# Patient Record
Sex: Male | Born: 1975 | Race: White | Hispanic: No | Marital: Married
Health system: Midwestern US, Community
[De-identification: ages and names within clinical notes are randomized; demographics above are authoritative.]

---

## 2013-01-21 LAB — POC HEMATOCRIT
HCT: 43 % (ref 40–54)
HGB: 14.8 gm/dl (ref 13.0–17.2)

## 2013-01-21 NOTE — Procedures (Signed)
[facility]  PULMONARY REPORT  NAME:  Maxwell Castro  SEX:   M  DATE: 01/21/2013  SS: [id number]  DOB: 1975/02/22  MR#    [id number]  ROOM: RT  BILLING: [id number]  REFERRING PHYSICIAN:        cc: Wallis Bamberg NP    REFERRING PROVIDER:  Wallis Bamberg, NP.     DIAGNOSIS:  Dyspnea on exertion.    COMMENTS:  Spirometry data is repeatable.  The patient followed instructions well and   appeared to give a maximal effort.  DLCO was corrected for a hemoglobin of   14.8.  No complaints of shortness of breath or cough.    SPIROMETRY:  Volume time curve analysis revealed forced expiratory time greater than 6   seconds with achievement of terminal plateau.  Flow volume curve analysis   revealed no significant flattening in either inspiratory or expiratory limb of   the flow volume curve.  FVC is 6.25 liters, 163% of predicted.  FEV-1 is 4.88   liters, 154% of predicted.  Ratio is 78.  Following bronchodilator therapy,   there is no significant improvement in either FEV1 or FVC values.  Based on   post-bronchodilator therapy testing, as per GOLD criteria, the patient has no   obstructive ventilatory impairment.    LUNG VOLUMES:  Lung volumes were measured by plethysmography.  Total lung capacity 7.09   liters, 118% of predicted.  RV is 0.84 liters, 48% of predicted.  Ratio is 12.    Mean airway resistance is within normal limits.    DIFFUSION CAPACITY:  Absolute DLCO is 28.7 mL/mmHg per minute which is 105% of predicted.    IMPRESSION:  1.  No obstructive ventilatory impairment.  2.  No significant response following bronchodilator therapy.  3.  No restrictive ventilatory impairment.  4.  Mild hyperinflation.  5.  Normal mean airway resistance.  6.  Normal diffusion capacity for carbon monoxide suggestive of preserved or   intact alveolar capillary basement membrane.  Please correlate clinically.      ___________________  Claudette Laws MD  Dictated By: .   [facility]  D:01/28/2013 07:11:49  T: 01/28/2013  08:25:48  [id number]  Authenticated by Claudette Laws, M.D. On 01/28/2013 09:09:04 PM

## 2013-01-28 NOTE — Procedures (Signed)
[facility]  PULMONARY REPORT  NAME:  Maxwell Castro  SEX:   M  DATE: 01/21/2013  SS: [id number]  DOB: Sep 10, 1975  MR#    [id number]  ROOM: RT  BILLING: [id number]  REFERRING PHYSICIAN:        cc: Wallis Bamberg NP    REFERRING PROVIDER:  Wallis Bamberg, NP.     DIAGNOSIS:  Dyspnea on exertion.    COMMENTS:  Spirometry data is repeatable.  The patient followed instructions well and   appeared to give a maximal effort.  DLCO was corrected for a hemoglobin of   14.8.  No complaints of shortness of breath or cough.    SPIROMETRY:  Volume time curve analysis revealed forced expiratory time greater than 6   seconds with achievement of terminal plateau.  Flow volume curve analysis   revealed no significant flattening in either inspiratory or expiratory limb of   the flow volume curve.  FVC is 6.25 liters, 163% of predicted.  FEV-1 is 4.88   liters, 154% of predicted.  Ratio is 78.  Following bronchodilator therapy,   there is no significant improvement in either FEV1 or FVC values.  Based on   post-bronchodilator therapy testing, as per GOLD criteria, the patient has no   obstructive ventilatory impairment.    LUNG VOLUMES:  Lung volumes were measured by plethysmography.  Total lung capacity 7.09   liters, 118% of predicted.  RV is 0.84 liters, 48% of predicted.  Ratio is 12.    Mean airway resistance is within normal limits.    DIFFUSION CAPACITY:  Absolute DLCO is 28.7 mL/mmHg per minute which is 105% of predicted.    IMPRESSION:  1.  No obstructive ventilatory impairment.  2.  No significant response following bronchodilator therapy.  3.  No restrictive ventilatory impairment.  4.  Mild hyperinflation.  5.  Normal mean airway resistance.  6.  Normal diffusion capacity for carbon monoxide suggestive of preserved or   intact alveolar capillary basement membrane.  Please correlate clinically.      ___________________  Claudette Laws MD  Dictated By: .   [facility]  D:01/28/2013 07:11:49   T: 01/28/2013 08:25:48  [id number]  Authenticated by Claudette Laws, M.D. On 01/28/2013 09:09:04 PM

## 2013-10-03 NOTE — Procedures (Unsigned)
Patient Name: Maxwell PatriciaJON  Castro  Account Number: [id number]  Date of Birth: 04/08/1975  Record Number: [id number]  Date of Procedure: 10/03/2013  Referring Physician(s):    Endoscopist: Samuel JesterBeth Jaklic    PROCEDURE PERFORMED:  Flexible Sigmoidoscopy  -  HET hemorrhoidal treatment  INDICATIONS FOR EXAMINATION:      Instruments:  [id number]  Medications: MAC Visualization: Good  Tolerance:  Good Complications:  None Extent of Exam: rectosigmoid  Limitations:     Specimens:  None Classes: ASA Class II  Estimated Blood Loss:    Procedure Technique: With the patient in left lateral position, a digital anal   exam was performed using xylocaine jelly and surgical lubricant for   lubrication.  The flexible sigmoidoscope was then introduced into the anal   canal under direct vision and guided into the rectum.  A retroflexed view of   the rectum was obtained to evaluate the degree of internal hemorrhoidal   involvement.  The scope was removed, and the HET Bipolar Forceps, a   tissue-ligating device, is introduced intra-anally.  The treatment window was   opened and the mucosa-submucosa layers, carrying the superior hemorrhoidal   blood supply to the hemorrhoid, as well as the apex of the internal hemorrhoid   proximal to the dentate line, were allowed to protrude into the lumen of the   device. The resulting tissue fold was clamped in the treatment window of the   HET Bipolar Forceps and ligated using bipolar RF energy. The treatment   continued until the tissue temperature reached 55C, which was monitored using   the HET Temperature Monitor. The treatment window of the HET Bipolar Forceps   then was opened and treatment area evaluated. The target tissue fold appeared   to be well-ligated and moderately ischemic without signs of necrosis. The   device was then repositioned and the procedure repeated circumferentially, for   a total of 8 treatments.  Upon completion of the hemorrhoid ligation procedure with the HET Bipolar    Forceps, the device was withdrawn from the patient.  The flexible scope was   then re-introduced into the anal canal and a retroflexed view of the rectum   was obtained. There was no bleeding, excessive tissue trauma, anal fissure or   any other complications observed, and the target areas appeared well treated.    The scope was removed. The patient tolerated procedure well and appeared   stable and comfortable at the end of the procedure.  FINDINGS:  Circumferential moderate sized internal hemorrhoids  ENDOSCOPIC DIAGNOSIS:  Internal hemorrhoids  RECOMMENDATIONS:  Continue high fiber diet and/or fiber supplements indefinitely  Follow up in my office in 4-6 weeks.  COMMENTS:        Signature:_________________________________ Samuel JesterBeth Jaklic , M.D.       This procedure was electronically signed off on(Endoscopy): 10/03/2013 9:45:12   AM by Samuel JesterBeth Jaklic,  M.D.

## 2018-10-22 IMAGING — MR MRI SHOULDER LT WO CONTRAST
5 series · 40 of 40 positions shown · non-contrast
Comparison: None available.

INDICATION: Injury of muscle and tendon of the rotator cuff of left shoulder, initial encounter.  Left shoulder pain with limited range of motion.
TECHNIQUE: Multiplanar, multisequence MR imaging of the left shoulder without contrast.

[Series 5: t2_axial_fs · axial · left · 3.0mm · 0.50mm/px · z∈[-26,+66]mm · 9 of 24 slices shown]
[im 1/24]
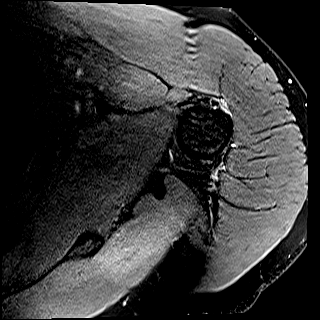
[im 3/24]
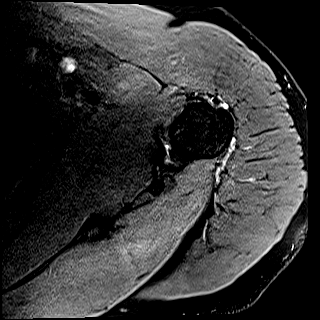
[im 6/24]
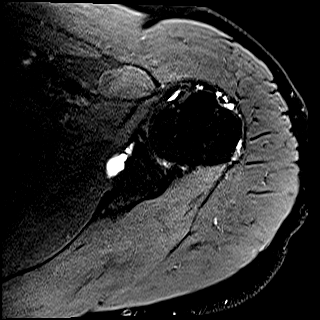
[im 9/24]
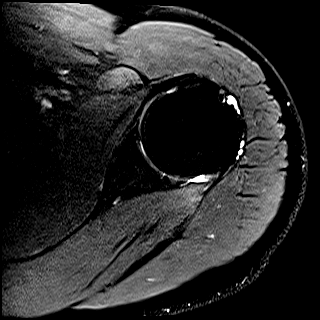
[im 12/24]
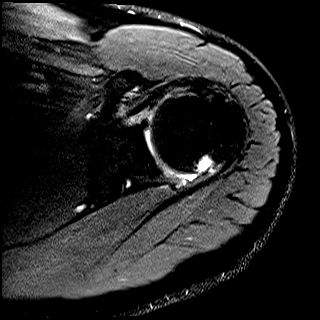
[im 15/24]
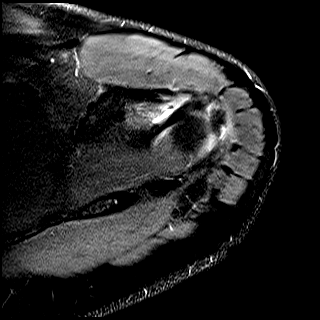
[im 18/24]
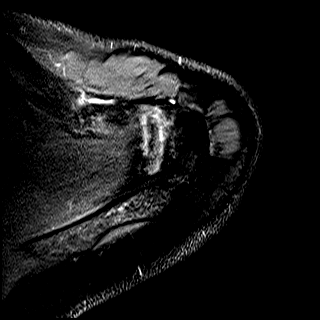
[im 21/24]
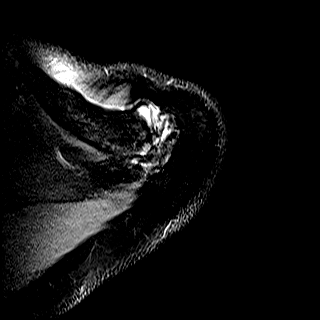
[im 24/24]
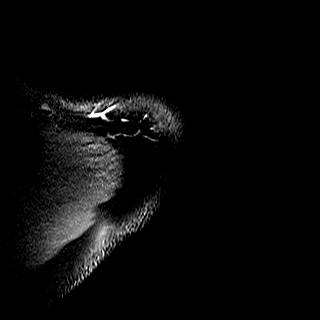

[Series 6: t2_cor_obl_fs · oblique · left · 3.0mm · 0.44mm/px · 7 of 19 slices shown (1 of 2)]
[im 1/19]
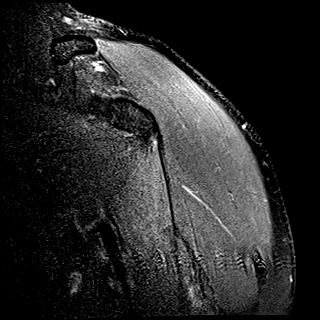
[im 4/19]
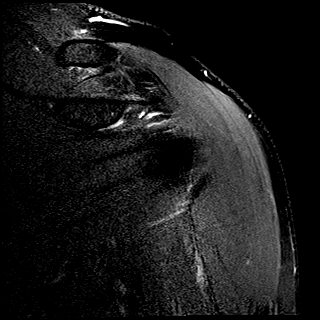
[im 7/19]
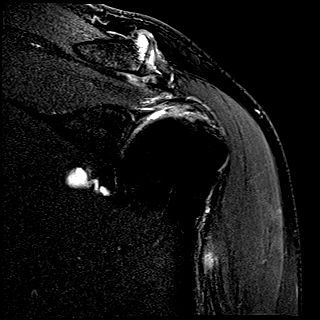
[im 10/19]
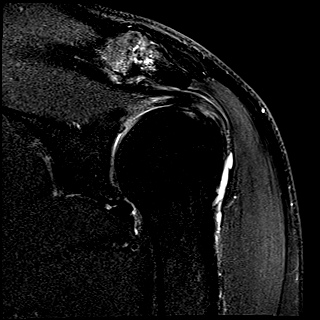
[im 13/19]
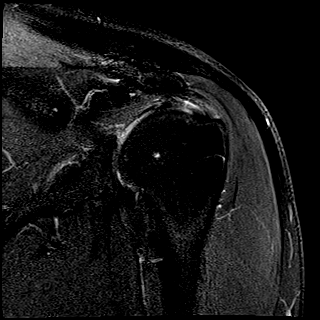
[im 16/19]
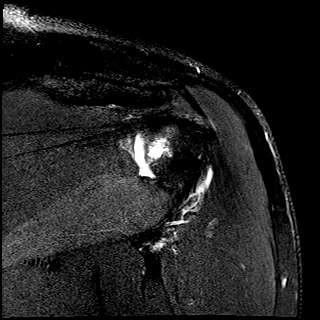
[im 19/19]
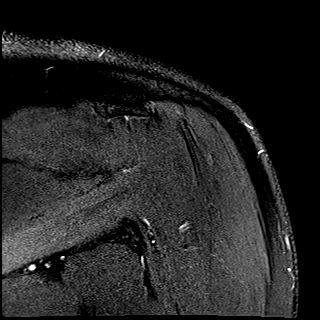

[Series 7: t2_sag_obl_fs · sagittal · left · 3.0mm · 0.44mm/px · 9 of 28 slices shown]
[im 1/28]
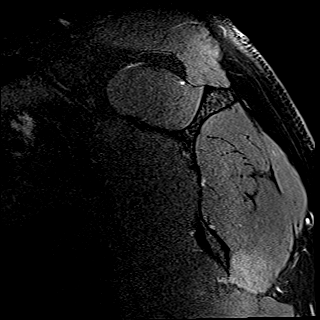
[im 4/28]
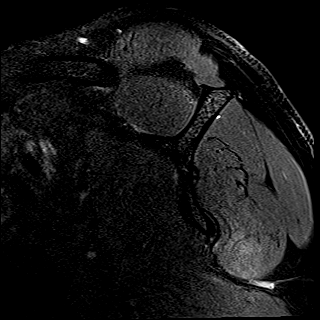
[im 7/28]
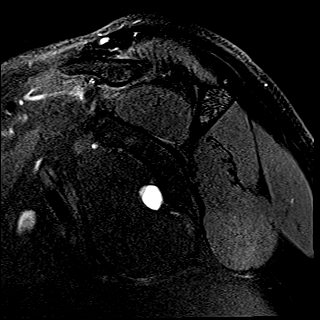
[im 11/28]
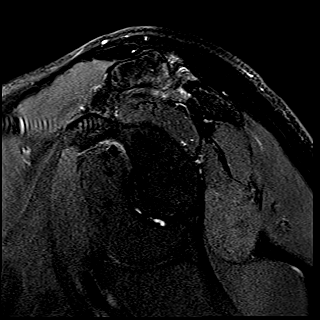
[im 14/28]
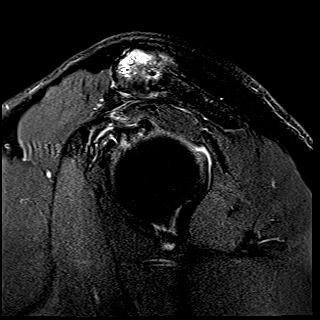
[im 17/28]
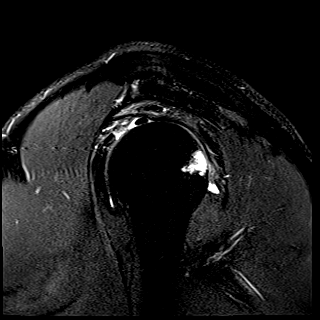
[im 21/28]
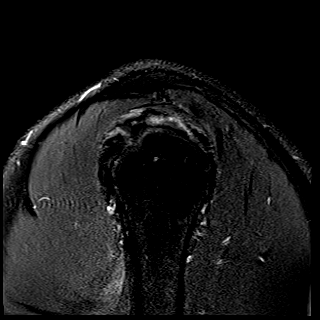
[im 24/28]
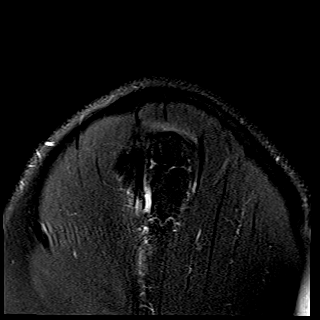
[im 28/28]
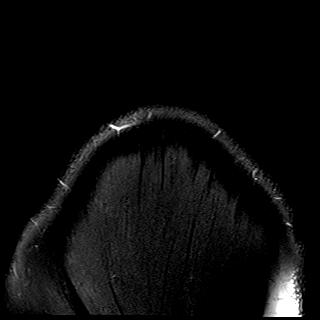

[Series 8: t1_sag_obl · sagittal · left · 3.0mm · 0.36mm/px · 9 of 28 slices shown]
[im 1/28]
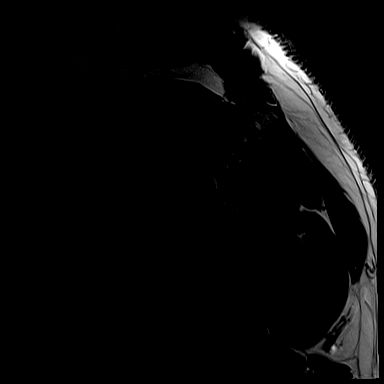
[im 4/28]
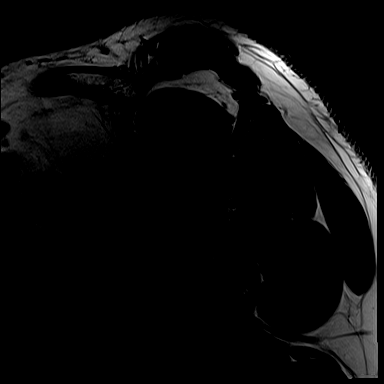
[im 7/28]
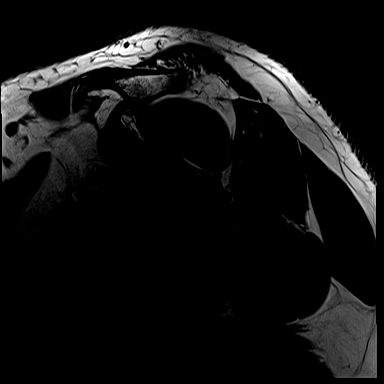
[im 11/28]
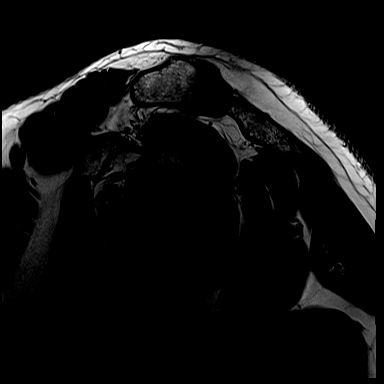
[im 14/28]
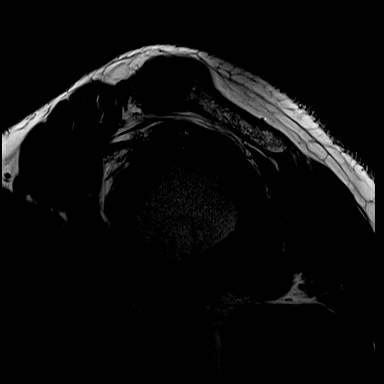
[im 17/28]
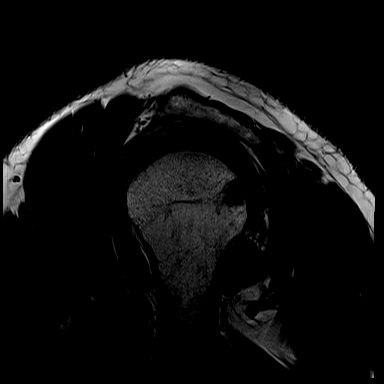
[im 21/28]
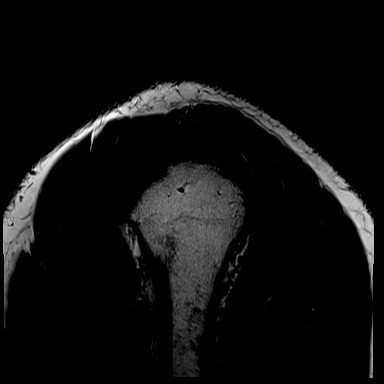
[im 24/28]
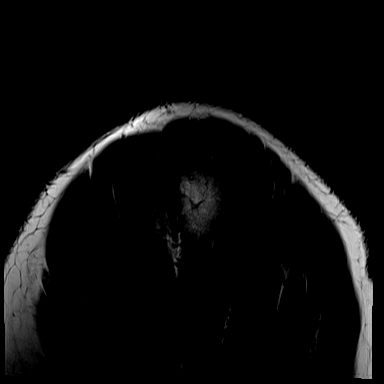
[im 28/28]
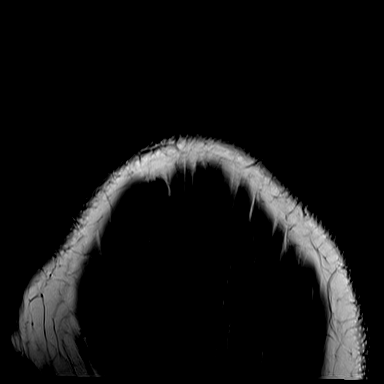

[Series 9: t2_cor_obl_fs · oblique · left · 3.0mm · 0.44mm/px · 6 of 19 slices shown (2 of 2)]
[im 1/19]
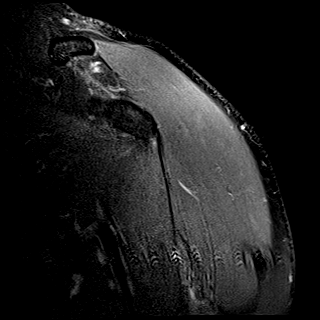
[im 4/19]
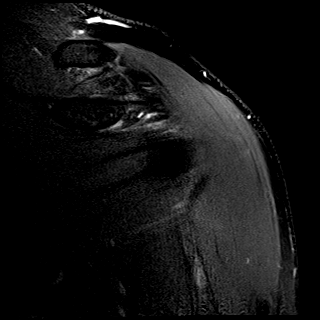
[im 8/19]
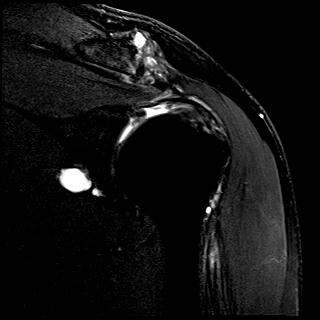
[im 11/19]
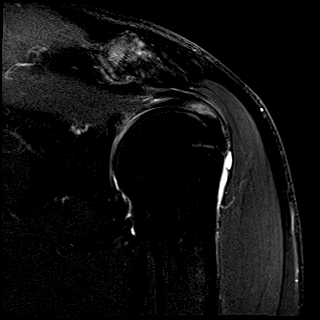
[im 15/19]
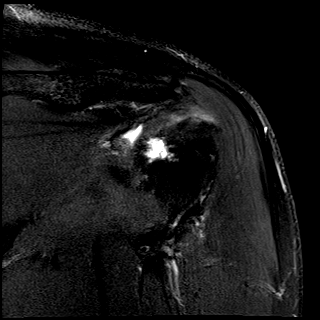
[im 19/19]
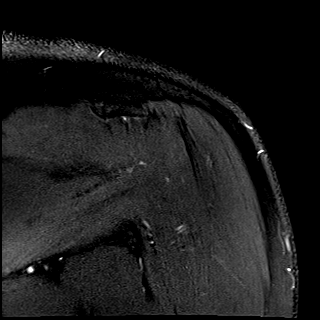

[40 of 40 positions shown; findings below may reference images not displayed]

FINDINGS: OSSEOUS ACROMIAL OUTLET: Moderate acromioclavicular joint osteoarthritis. Trace fluid is present within the subacromial/subdeltoid bursa. The acromion is nonhooked. The coracoclavicular and coracoacromial ligaments are intact.

ROTATOR CUFF: Mild supraspinatus and anterior infraspinatus tendinosis with mild articular surface fraying of the supraspinatus tendon, medial to the footprint. The teres minor and subscapularis tendons are intact. The rotator cuff musculature is maintained. Mild edema is present within the teres minor muscle, which can be seen in the setting of quadrilateral space syndrome. No mass is noted within the quadrilateral space.

LABRUM: Tearing of the anterior inferior and inferior labrum is present with a large paralabral cyst measuring 2.2 x 0.8 x 1.1 cm extending from the anterior inferior labrum, medially along the anterior glenoid. Fraying of the superior labrum.

BICEPS TENDON: The proximal intra-articular and extra-articular long head biceps tendon are intact.

OSSEOUS AND GLENOHUMERAL JOINT: No acute fracture, avascular necrosis or aggressive osseous lesion. The glenohumeral articular cartilage is maintained. No focal chondral defect is identified. Glenohumeral joint is normal in alignment.

OTHER: The inferior glenohumeral ligament is unremarkable. No glenohumeral joint effusion.
IMPRESSION: 1.
Mild supraspinatus and infraspinatus tendinosis without significant partial-thickness or full-thickness rotator cuff tear.

2.
Tearing of the anterior inferior and inferior labrum with large paralabral cyst.

3.
Mild edema within the teres minor muscle without associated fatty atrophy. These findings can be seen in the setting of quadrilateral space syndrome. No mass is noted within the quadrilateral space.

4.
Moderate acromioclavicular joint osteoarthritis.

## 2019-05-06 IMAGING — CR C-SPINE 2 or 3 views
1 series · 3 of 3 positions shown · non-contrast
Comparison: None

Cervical spine radiographs, 3 views
INDICATION: Neck pain

[Series 2: lat · 0.17mm/px · 3 of 3 slices shown]
[im 1/3]
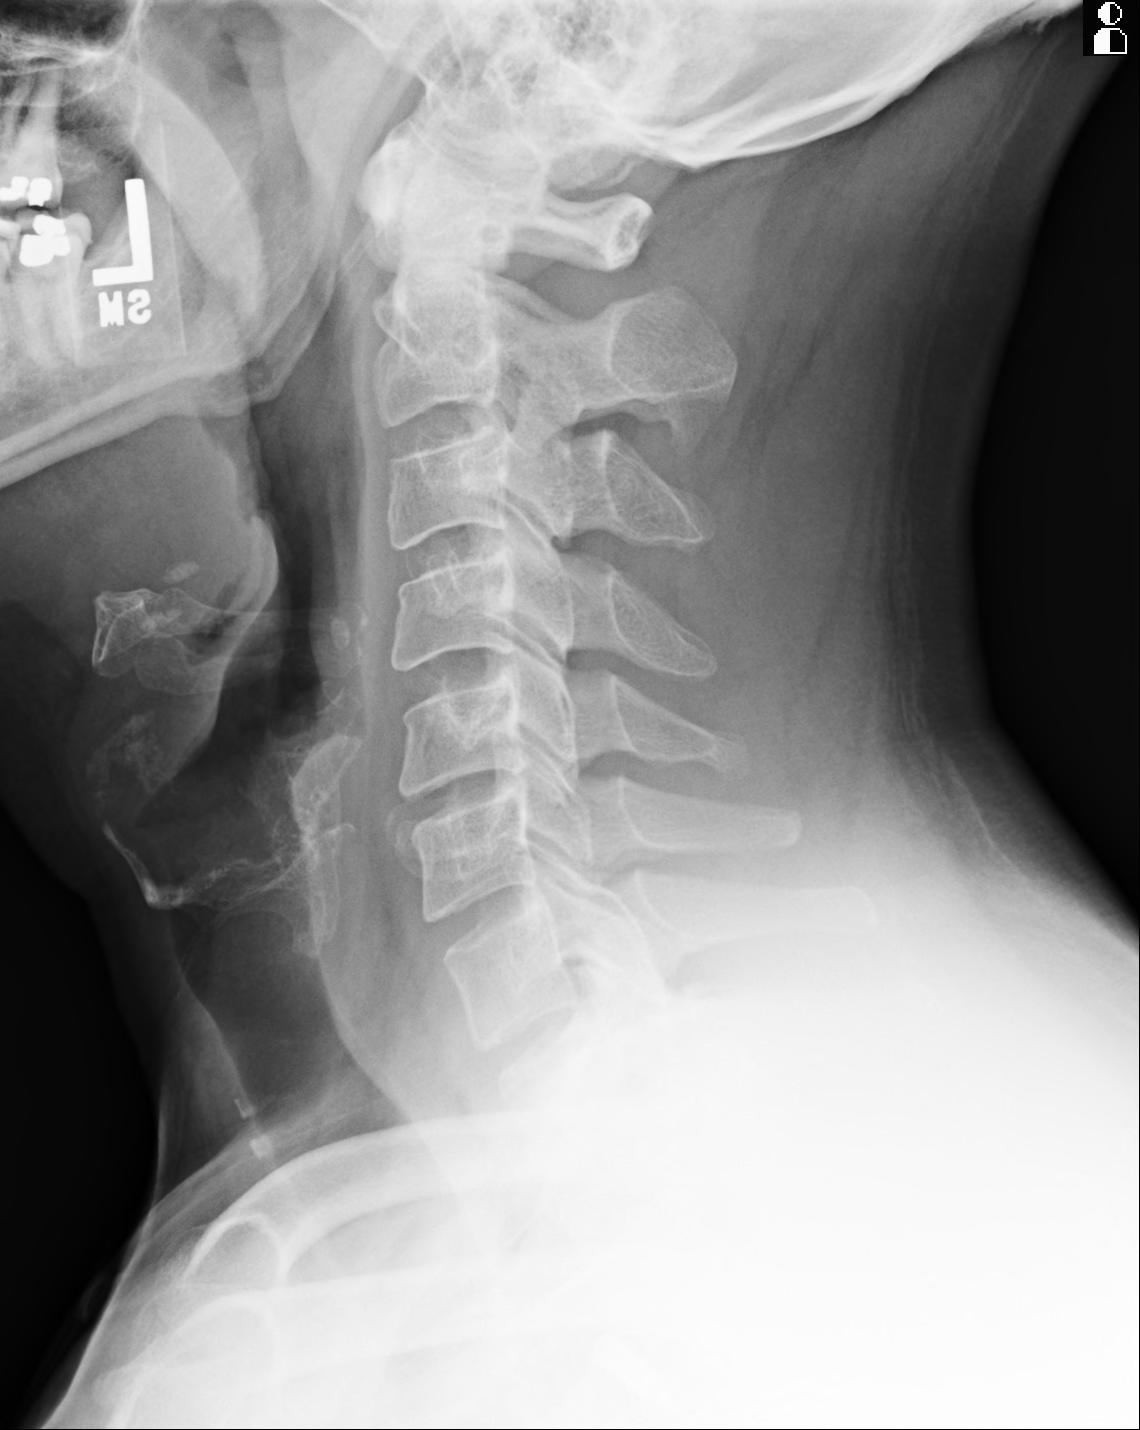
[im 2/3]
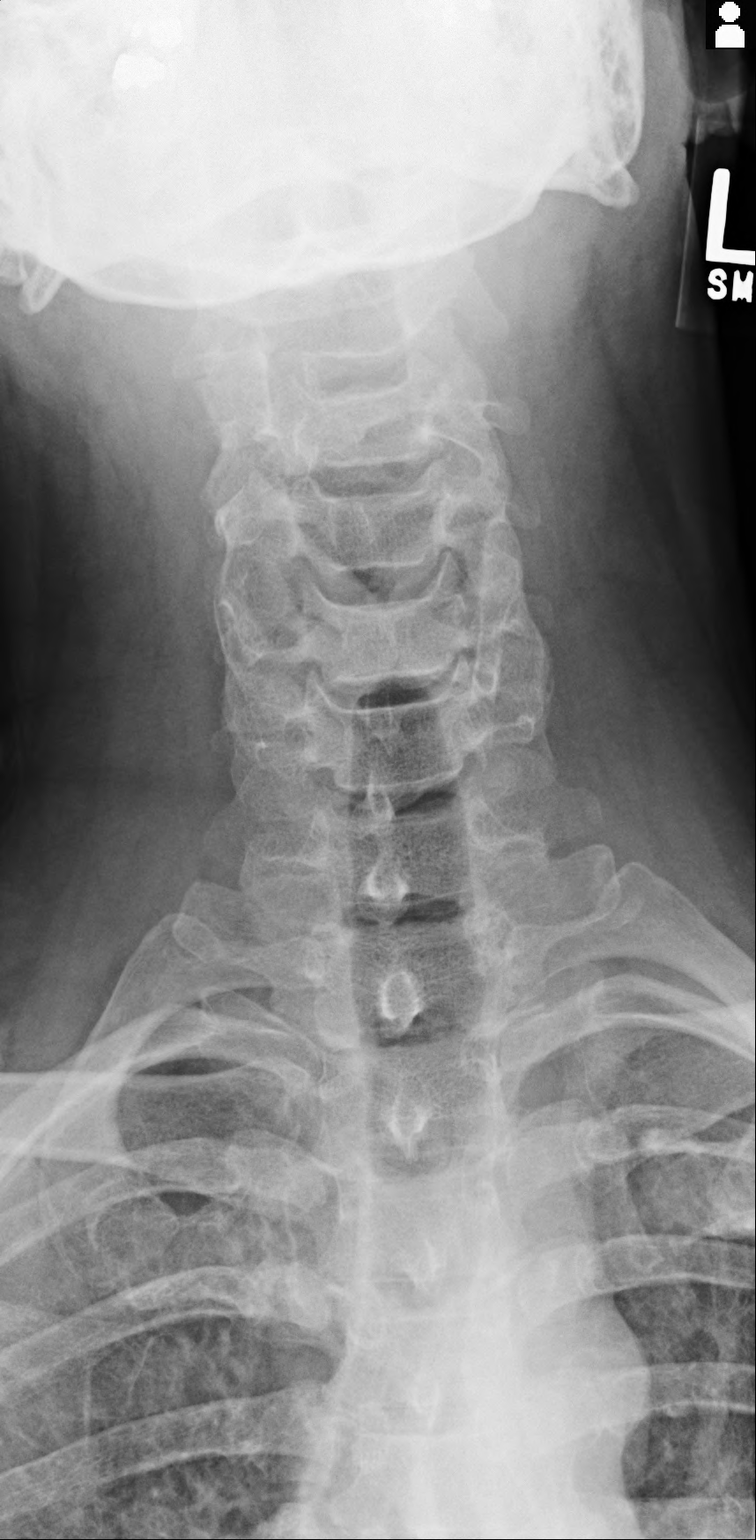
[im 3/3]
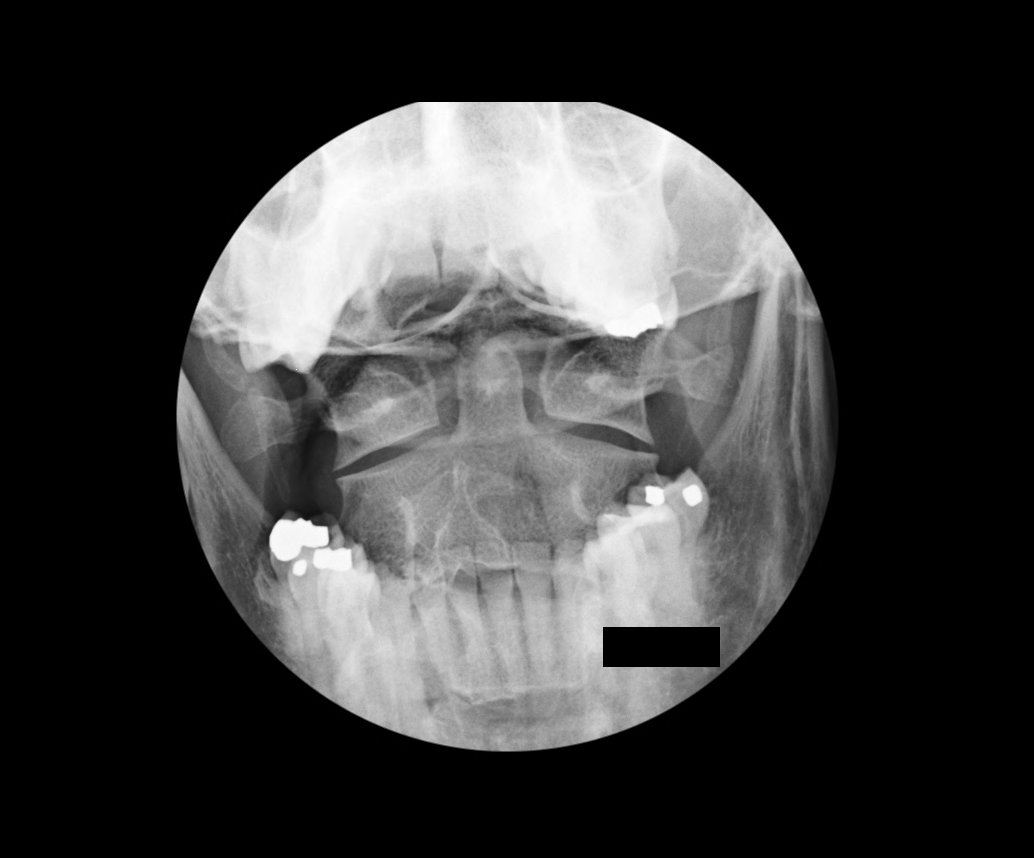

[3 of 3 positions shown; findings below may reference images not displayed]

FINDINGS: No acute fracture. No subluxation. Disc heights are intact. No facet arthrosis. Prevertebral soft tissues are unremarkable. Mild adenoid hypertrophy.
IMPRESSION: Unremarkable cervical spine radiographs.

## 2019-05-28 IMAGING — MR MRI CSPINE WO CONTRAST
5 series · 39 of 48 positions shown · non-contrast
Comparison: Cervical spine radiographs 05/06/2019.

INDICATION: Ataxia.
TECHNIQUE: Multiplanar, multiecho imaging of the cervical spine was performed, including T1-weighted and fluid sensitive sequences without Intravenous contrast.

[Series 1: bSSFP · axial · 6.0mm · 1.17mm/px · z∈[-51,+142]mm · 10 of 22 slices shown]
[im 1/22]
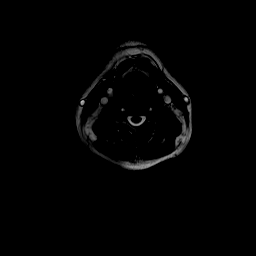
[im 3/22]
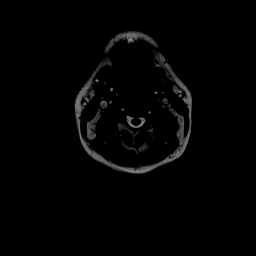
[im 5/22]
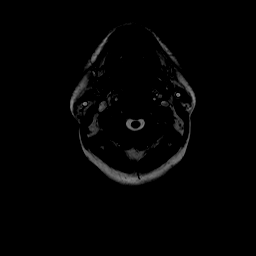
[im 8/22]
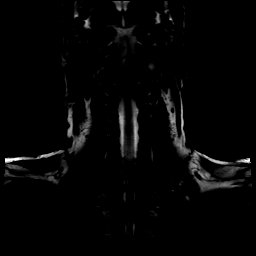
[im 10/22]
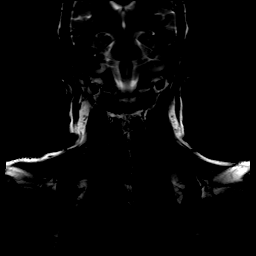
[im 12/22]
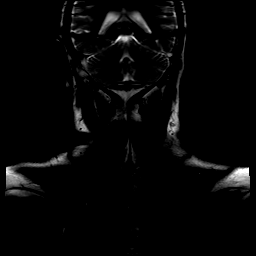
[im 15/22]
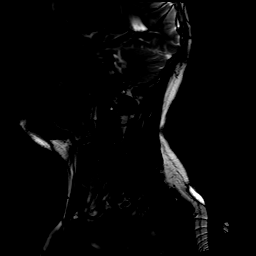
[im 17/22]
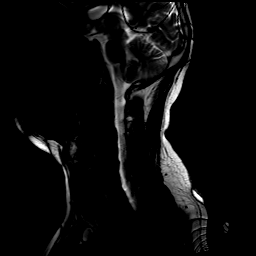
[im 19/22]
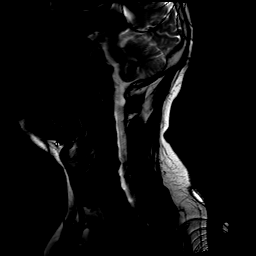
[im 22/22]
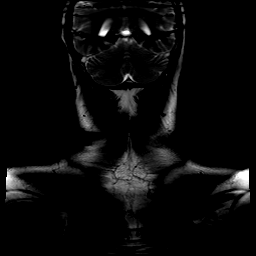

[Series 2: t2_sag · sagittal · 3.0mm · 0.57mm/px · 7 of 14 slices shown]
[im 1/14]
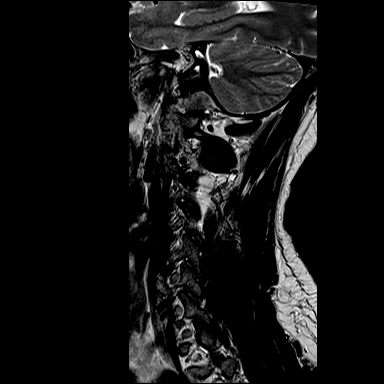
[im 3/14]
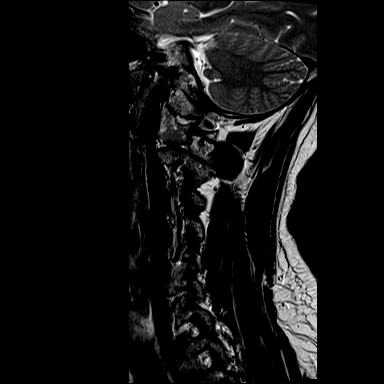
[im 5/14]
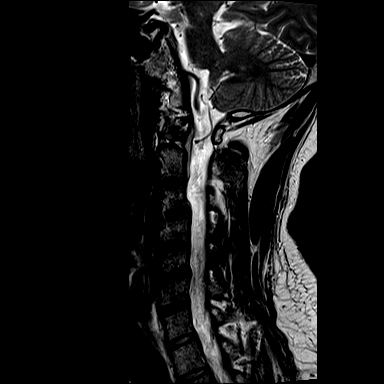
[im 7/14]
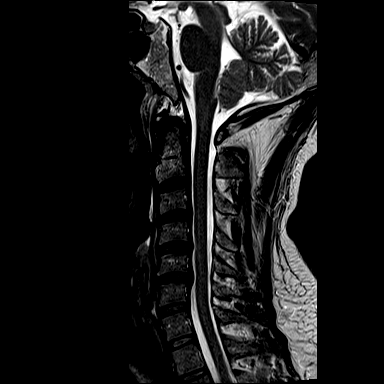
[im 9/14]
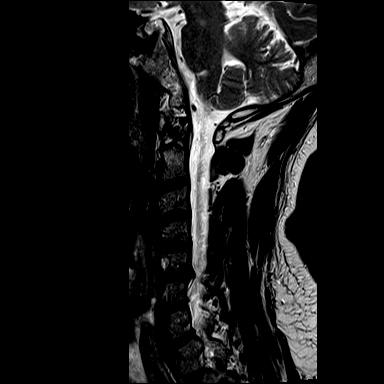
[im 11/14]
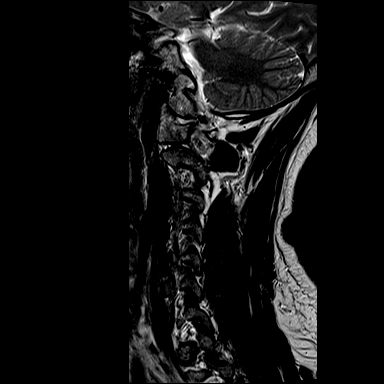
[im 14/14]
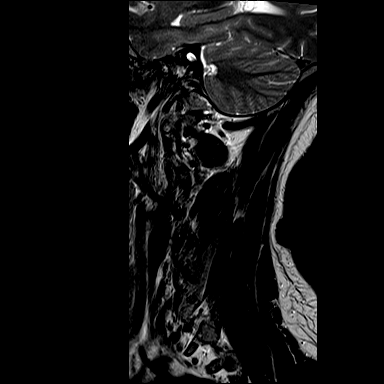

[Series 3: t1_sag · sagittal · 3.0mm · 0.57mm/px · 7 of 14 slices shown]
[im 1/14]
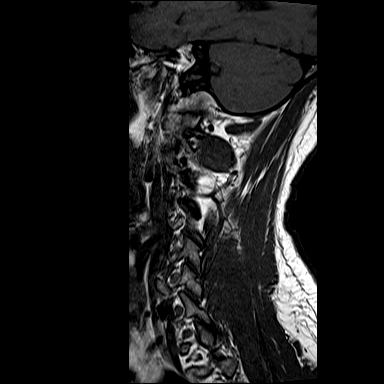
[im 3/14]
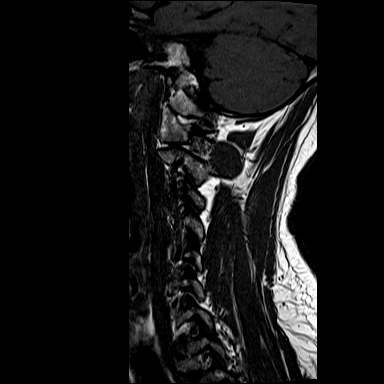
[im 5/14]
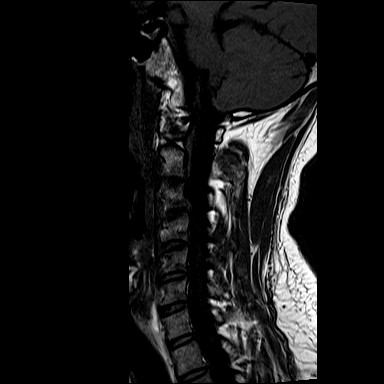
[im 7/14]
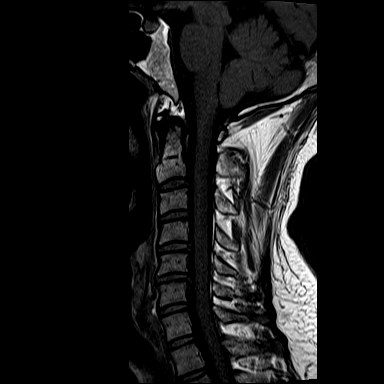
[im 9/14]
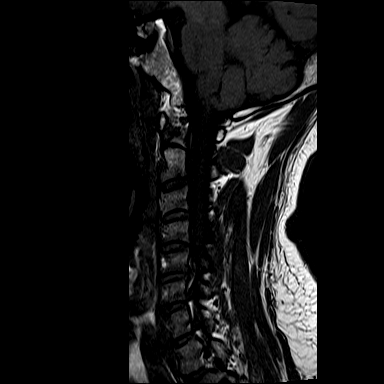
[im 11/14]
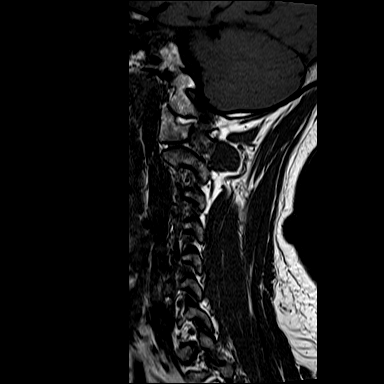
[im 14/14]
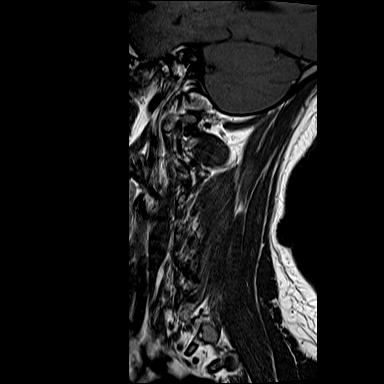

[Series 4: ir_sag · sagittal · 3.0mm · 0.69mm/px · 7 of 14 slices shown]
[im 1/14]
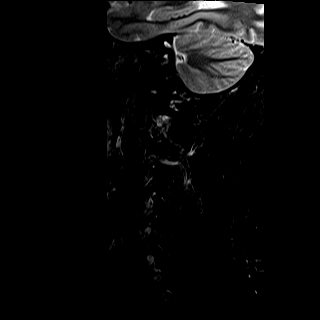
[im 3/14]
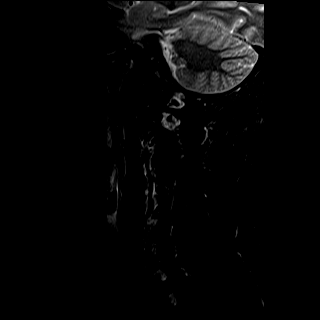
[im 5/14]
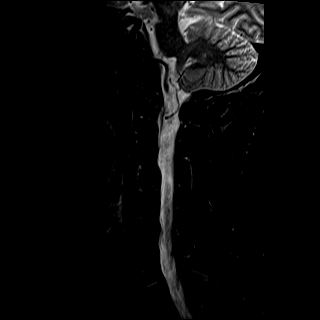
[im 7/14]
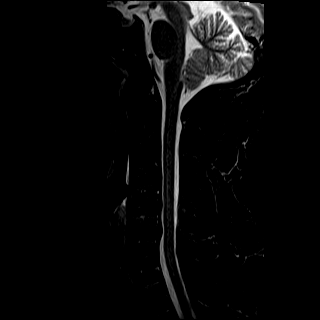
[im 9/14]
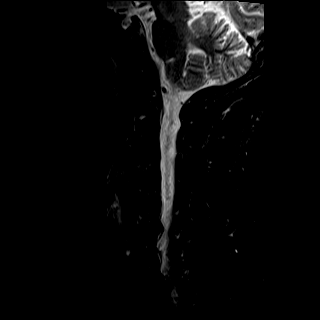
[im 11/14]
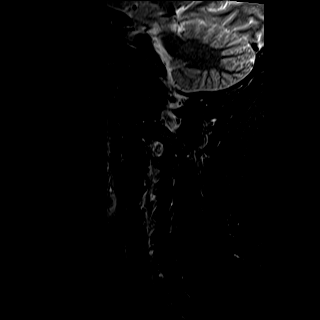
[im 14/14]
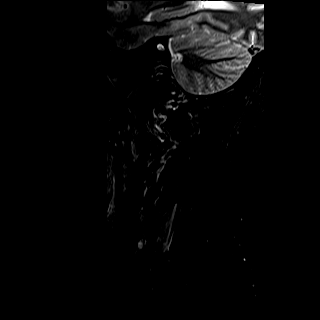

[Series 5: t2_medic_axial · axial · 3.0mm · 0.25mm/px · z∈[-99,+9]mm · 8 of 34 slices shown]
[im 3/34]
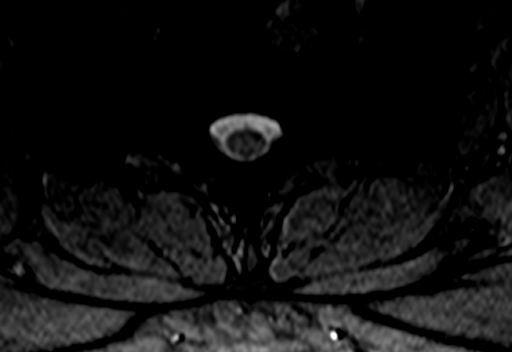
[im 7/34]
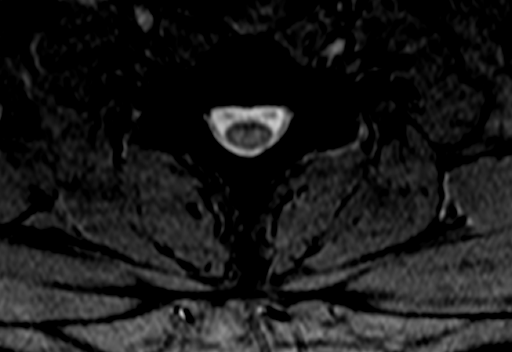
[im 11/34]
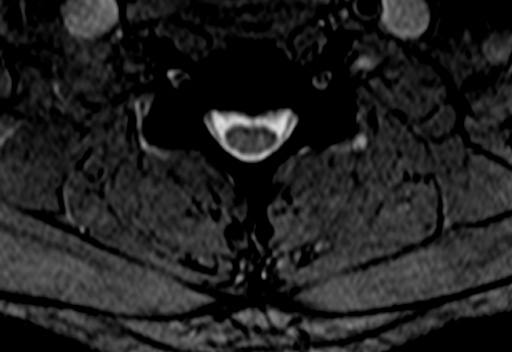
[im 15/34]
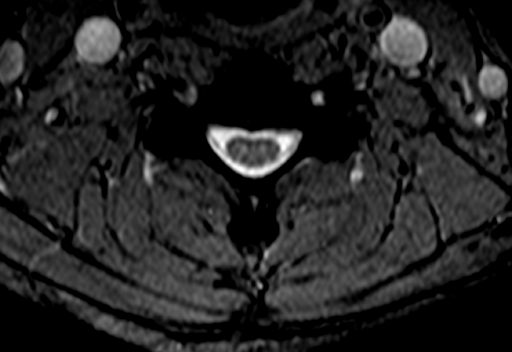
[im 19/34]
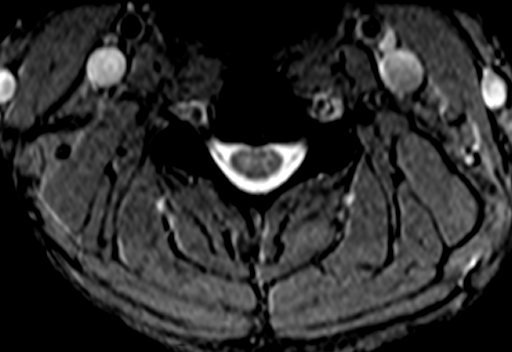
[im 23/34]
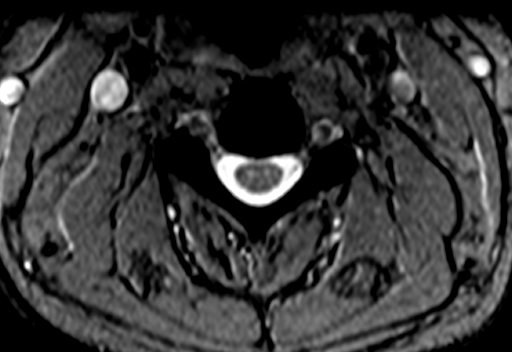
[im 27/34]
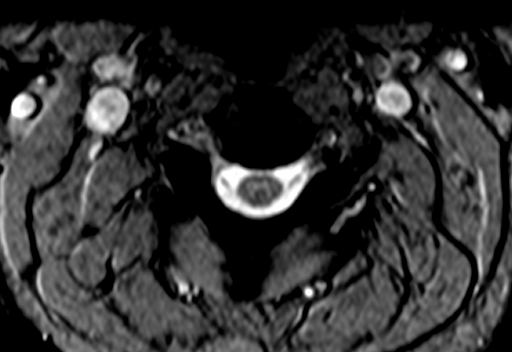
[im 31/34]
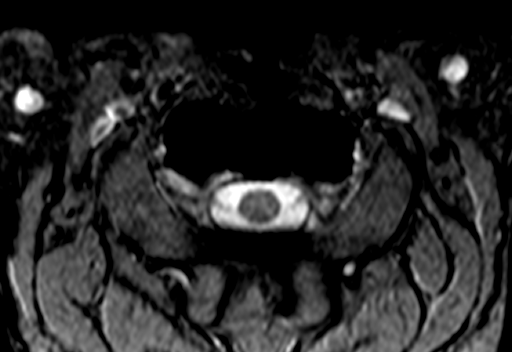

[39 of 48 positions shown; findings below may reference images not displayed]

FINDINGS: Posterior fossa:  The visualized posterior fossa is normal in appearance.  

Cervical spinal cord:  The cervical spinal cord has normal signal intensity.  

Bone marrow:  Regional bone marrow is normal in appearance. 

Alignment: The alignment of the cervical spine is normal on these supine, neutral images.

C2-C3: No disc protrusion, canal narrowing, or foraminal narrowing.

C3-C4: Minimal disc osteophyte complex. No canal or foraminal stenosis. No facet arthrosis.

C4-C5: Mild disc osteophyte complex. No canal or foraminal stenosis. No facet arthrosis.

C5-C6: Mild disc osteophyte complex. Mild canal stenosis. Mild right foraminal stenosis.

C6-C7: No disc protrusion, canal narrowing, or foraminal narrowing.

C7-T1: No disc protrusion, canal narrowing, or foraminal narrowing.
IMPRESSION: Mild disc osteophyte complex at C5-C6 with mild canal and mild right foraminal stenosis.

## 2022-07-27 IMAGING — CT CT BRAIN WO CONTRAST
3 of 4 series · 14 of 47 positions shown, 16 images · non-contrast
Comparison: None

Images Obtained from [facility]
HISTORY: Unspecified subjective visual disturbance
TECHNIQUE: Noncontrast CT of the head. Coronal and sagittal imaging were provided for interpretation. Automated exposure control and adjustment of the mA and/or kV according to patient size.
Dose reduction technique used:  Automated exposure control and adjustment of the mA and/or kV according to patient size. CT Studies and Cardiac Nuclear Medicine Studies in last 12-months = 0

[Series 2: brain · axial · 0.33mm/px · z∈[-606,-470]mm · 8 of 58 slices shown, 10 images]
[im 5/58  brain]
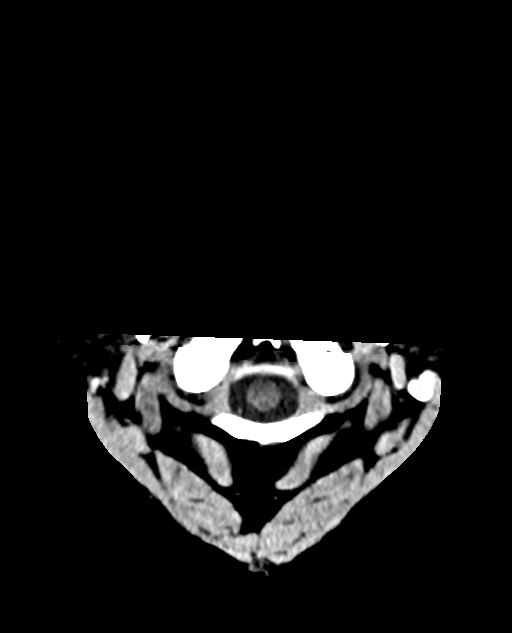
[im 5/58  bone]
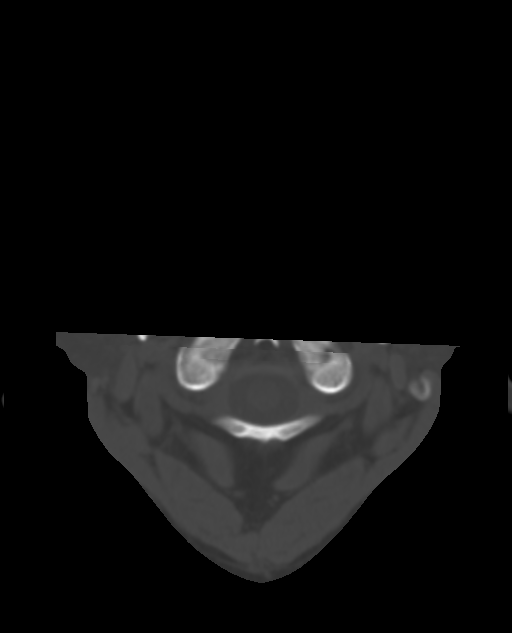
[im 13/58  brain]
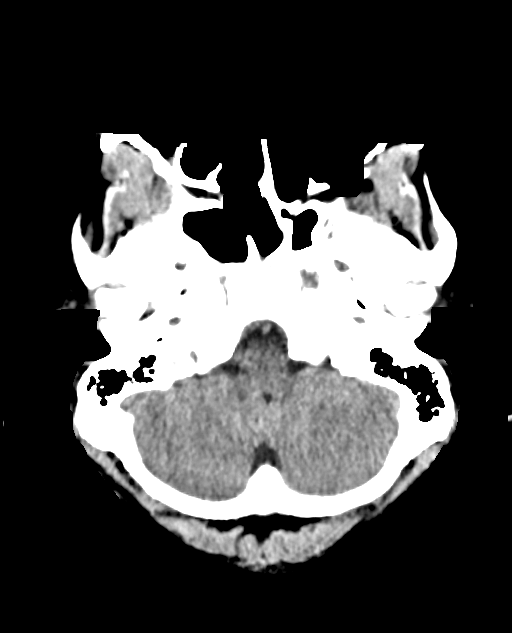
[im 21/58  brain]
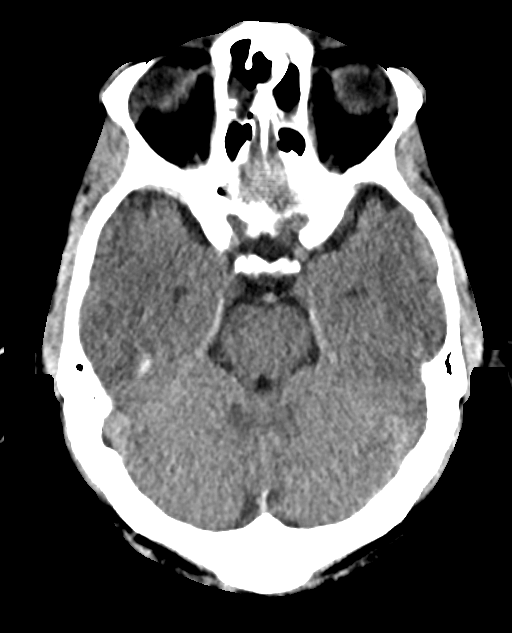
[im 25/58  brain]
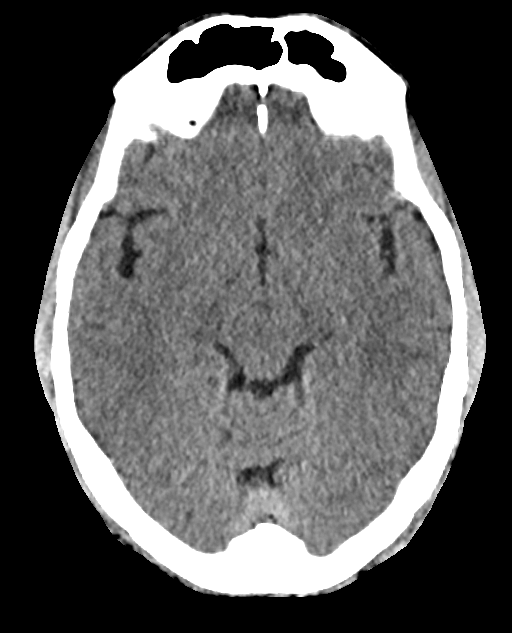
[im 33/58  brain]
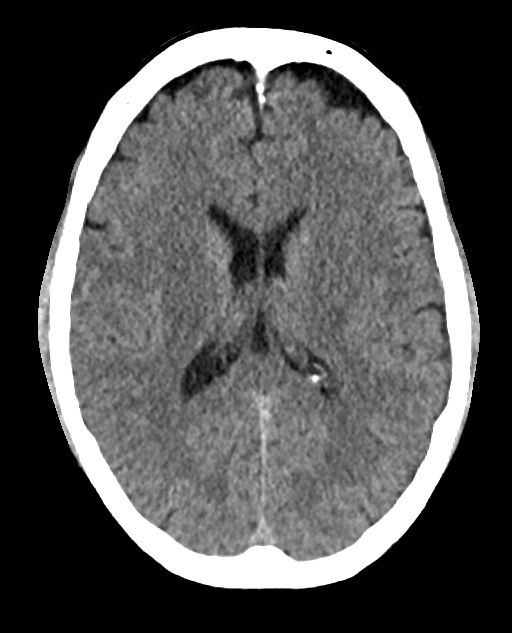
[im 33/58  bone]
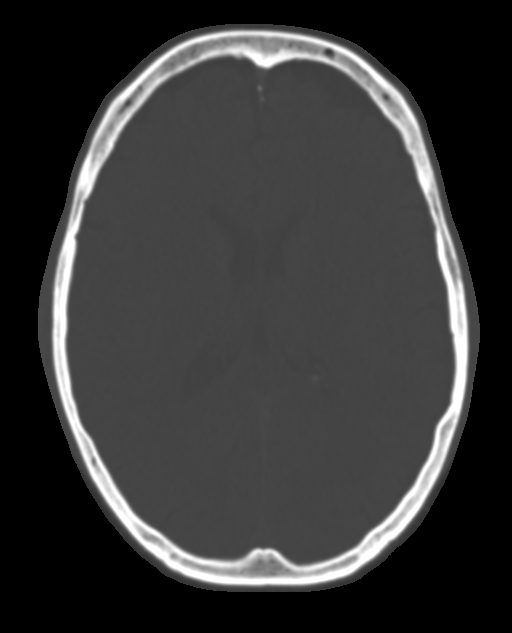
[im 37/58  brain]
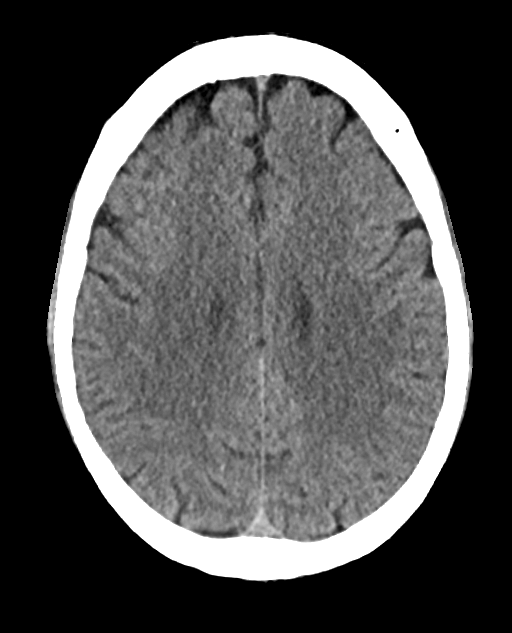
[im 45/58  brain]
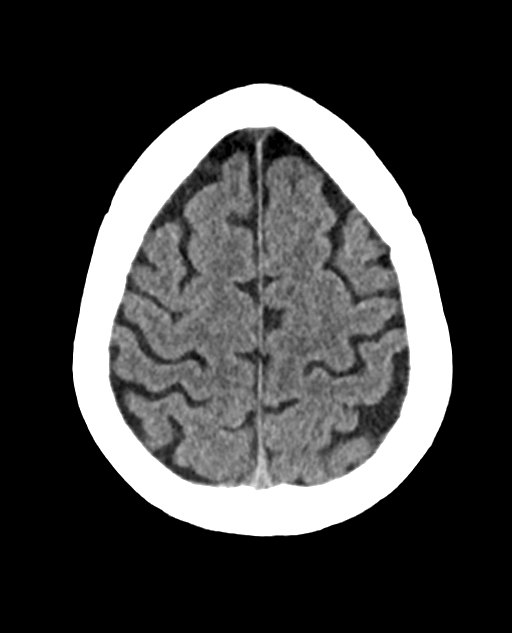
[im 53/58  brain]
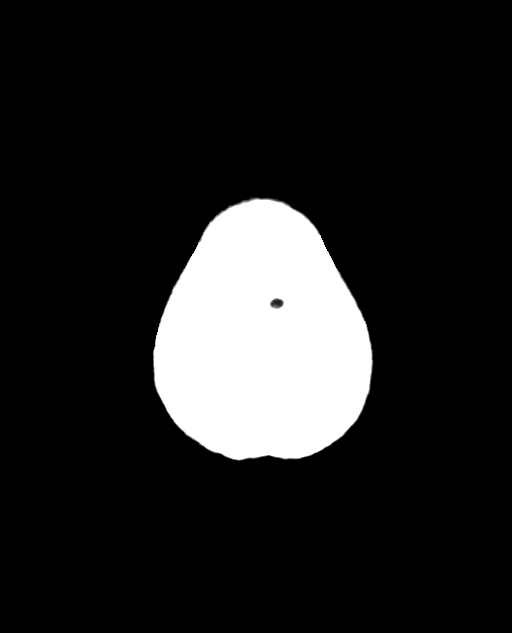

[Series 6: coronal brain · coronal · 0.33mm/px · 3 of 69 slices shown]
[im 23/69  brain]
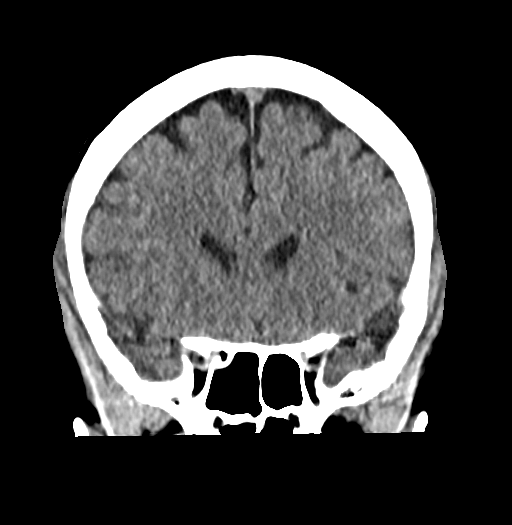
[im 31/69  brain]
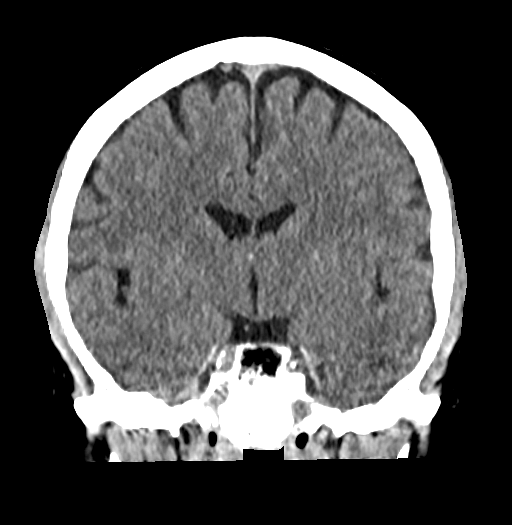
[im 38/69  brain]
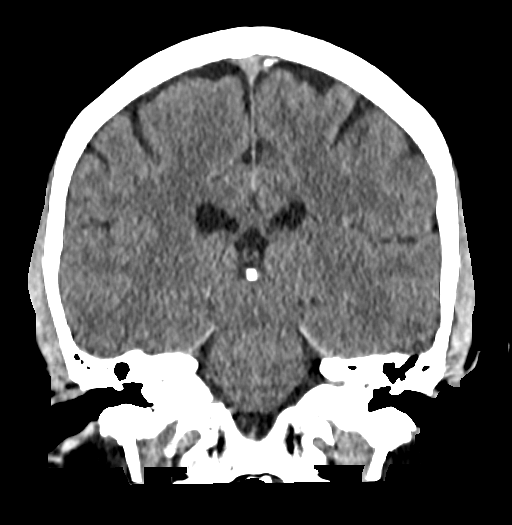

[Series 8: sagittal brain · sagittal · 0.34mm/px · 3 of 57 slices shown]
[im 19/57  brain]
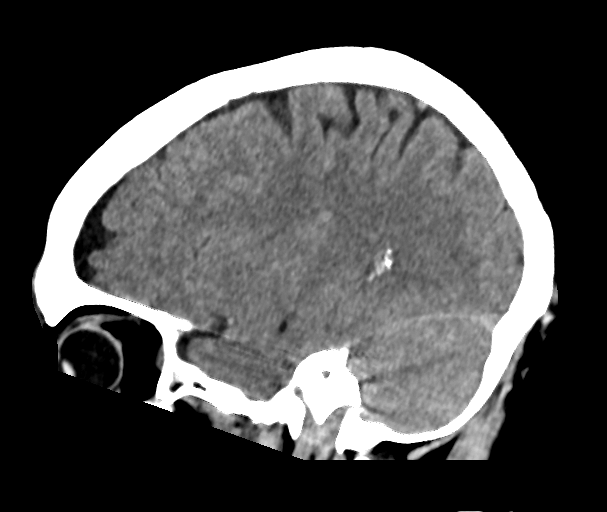
[im 29/57  brain]
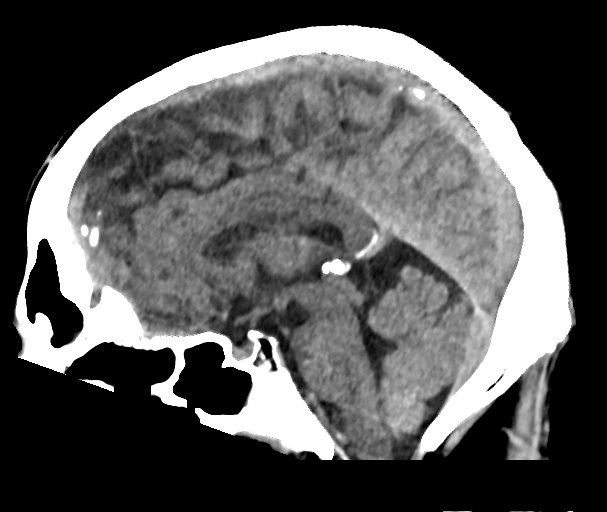
[im 38/57  brain]
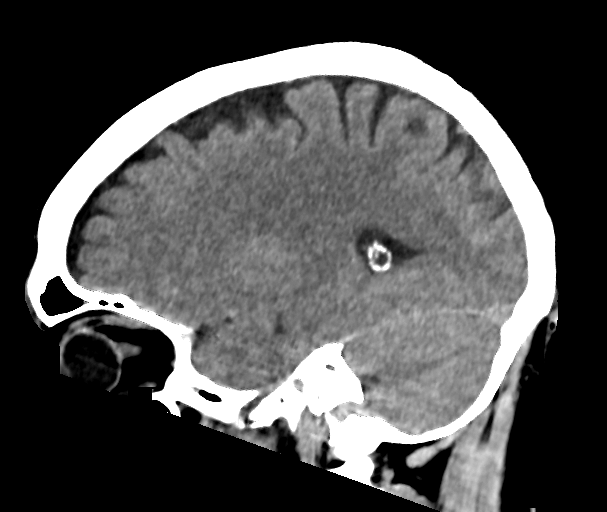

[14 of 47 positions shown; findings below may reference images not displayed]

FINDINGS: No midline shift or mass effect. No extra-axial fluid collection. No evidence of acute intracranial hemorrhage, mass, or cerebral edema. Ventricles and cortical sulci are normal.
No fracture. Mastoid air cells are clear. Mild mucoperiosteal thickening of the ethmoid air cells.
IMPRESSION: No acute intracranial findings.
Total radiation dose to patient is CTDIvol 36.10 mGy and DLP 647.00 mGy-cm.

## 2023-01-11 IMAGING — MR MRI TSPINE WO/W CONTRAST
5 of 12 series · 18 of 48 positions shown · IV contrast (prohance)
Comparison: None

Images Obtained from [facility]
INDICATION: demyelinating disease of central nervous system unspecified.
TECHNIQUE: Multiplanar, multiecho imaging of the thoracic spine was performed prior to and following 15 mL ProHance intravenous contrast, including T1-weighted and fluid sensitive sequences.

[Series 16: t2_tse_sag · sagittal · 3.0mm · 0.59mm/px · 3 of 18 slices shown]
[im 1/18]
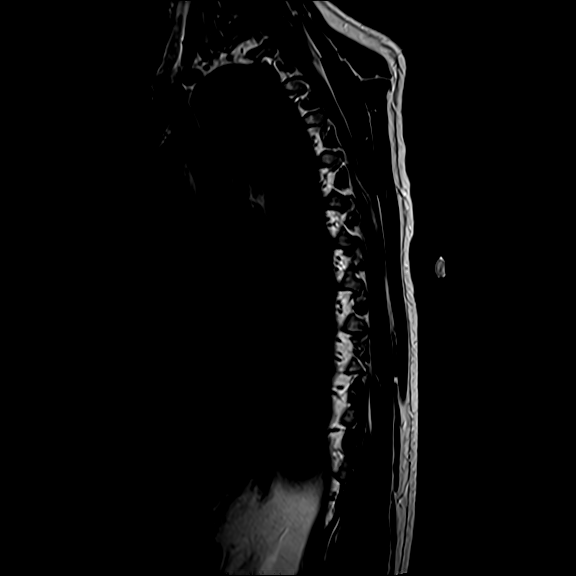
[im 9/18]
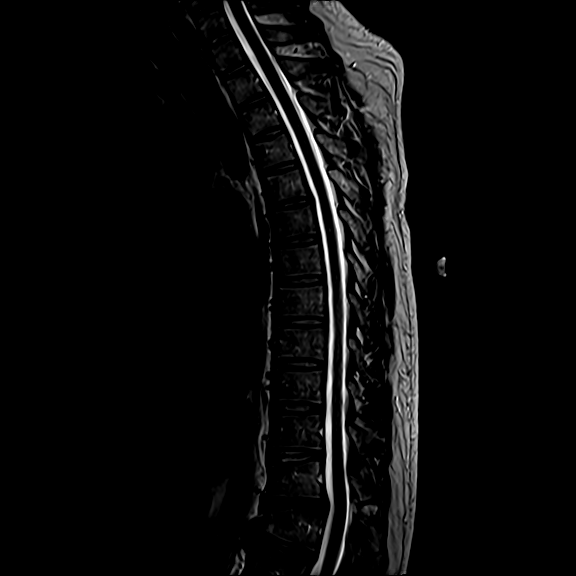
[im 18/18]
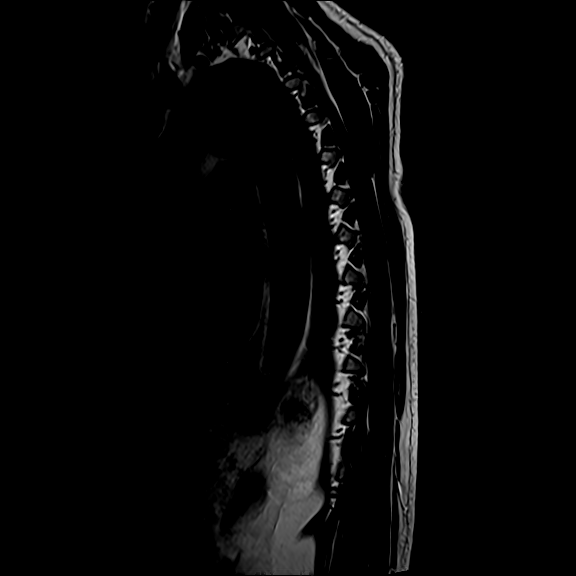

[Series 17: t1_sag · sagittal · 3.0mm · 0.59mm/px · 3 of 18 slices shown]
[im 1/18]
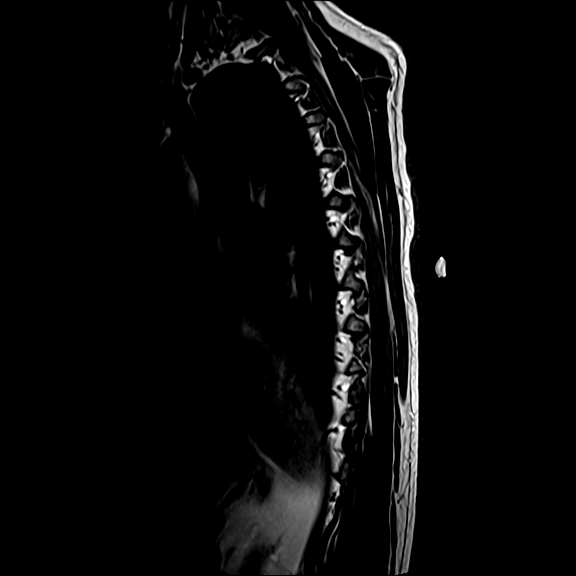
[im 9/18]
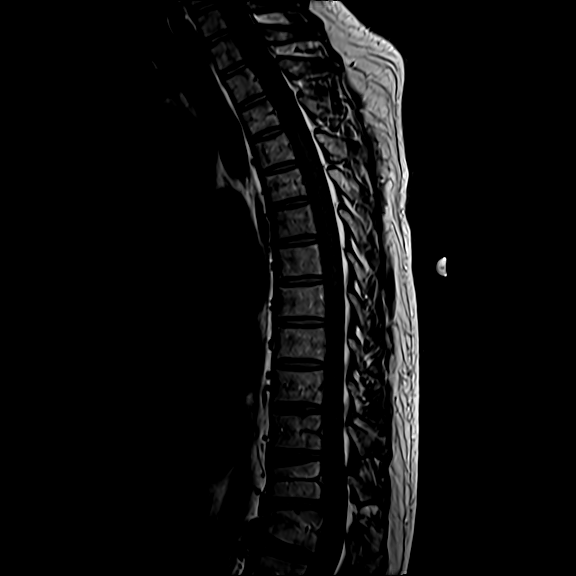
[im 18/18]
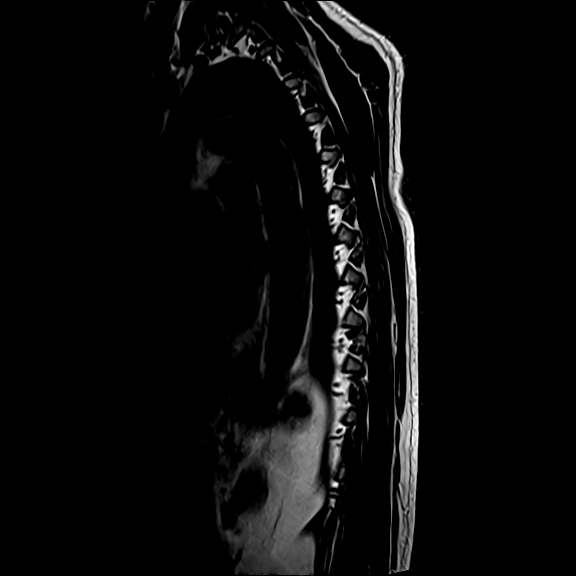

[Series 18: ir_sag · sagittal · 3.0mm · 0.66mm/px · 3 of 18 slices shown]
[im 1/18]
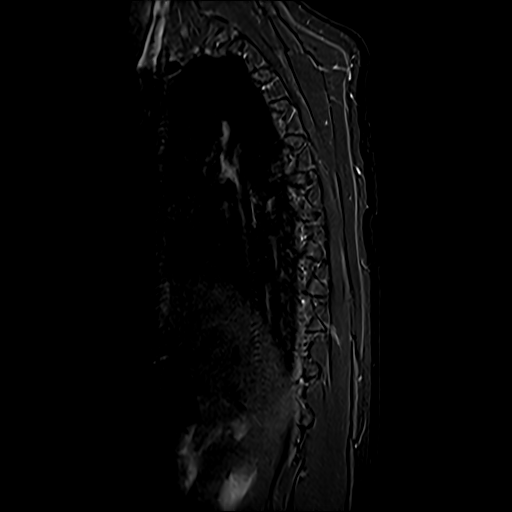
[im 9/18]
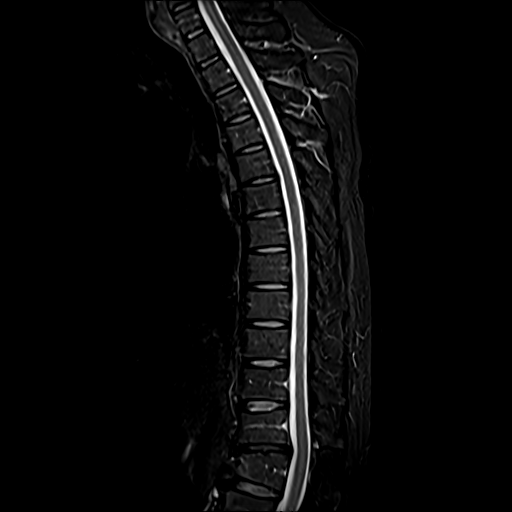
[im 18/18]
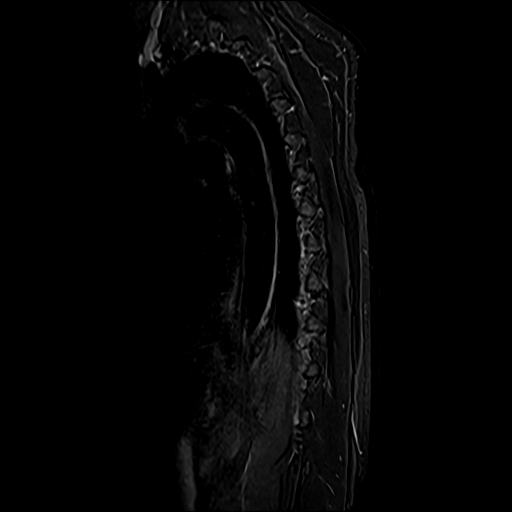

[Series 19: t2_axial · axial · 3.5mm · 0.39mm/px · z∈[-213,-83]mm · 4 of 30 slices shown (1 of 2)]
[im 1/30]
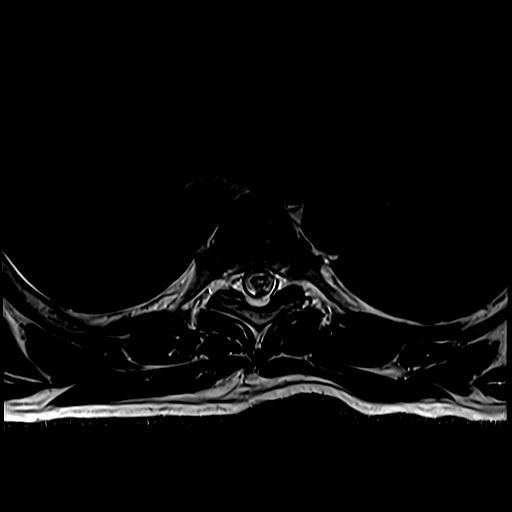
[im 10/30]
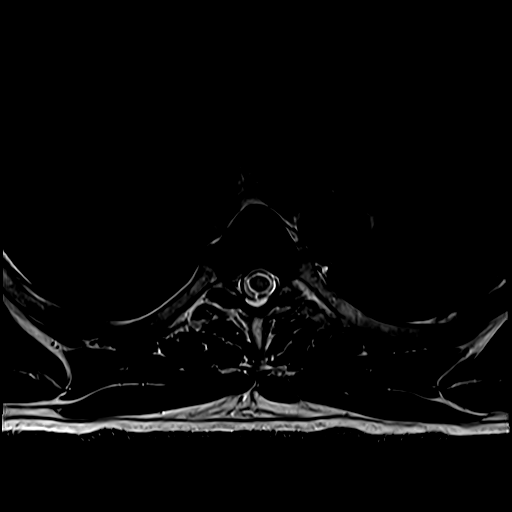
[im 20/30]
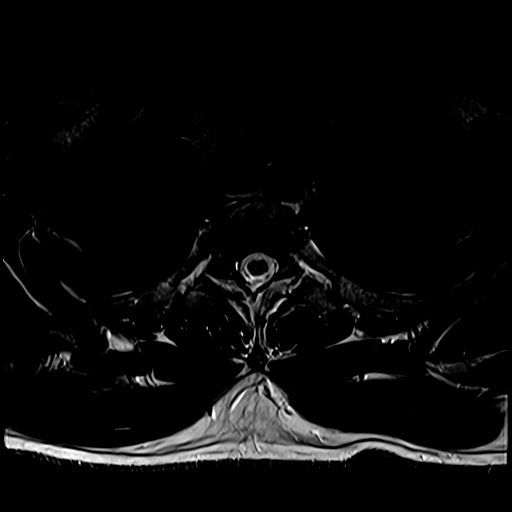
[im 30/30]
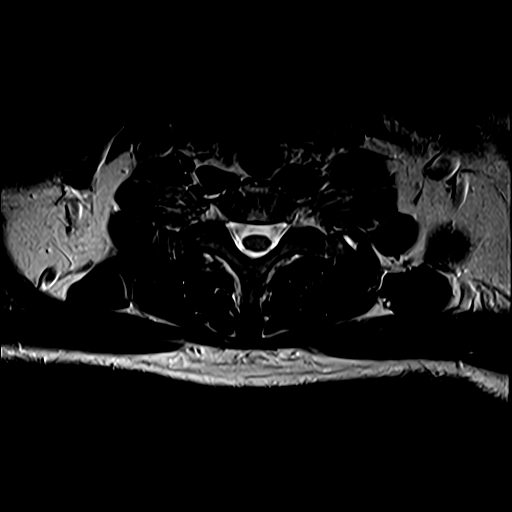

[Series 20: t2_axial · axial · 3.5mm · 0.39mm/px · z∈[-334,-163]mm · 5 of 38 slices shown (2 of 2)]
[im 1/38]
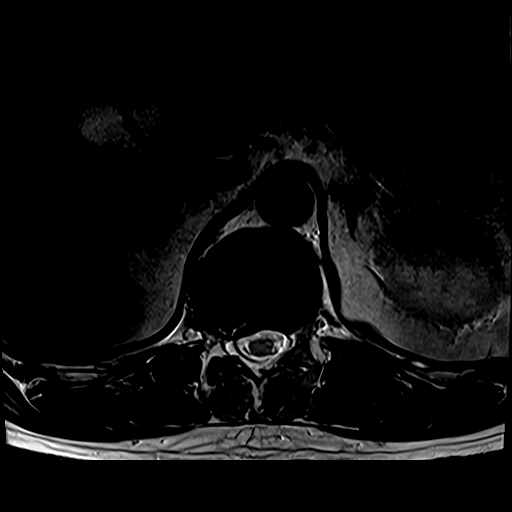
[im 10/38]
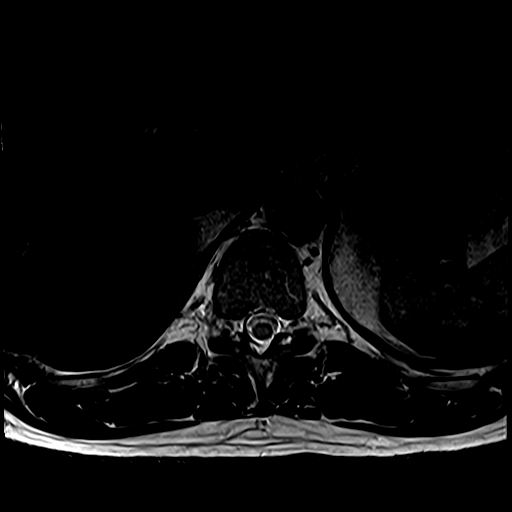
[im 19/38]
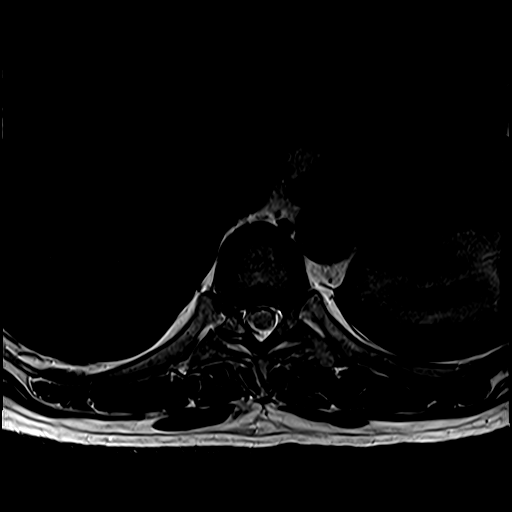
[im 28/38]
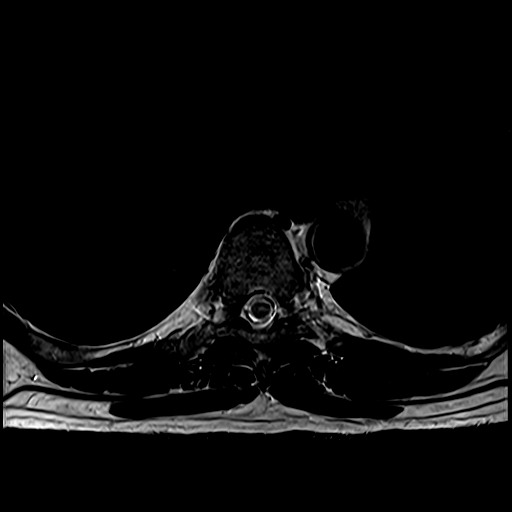
[im 38/38]
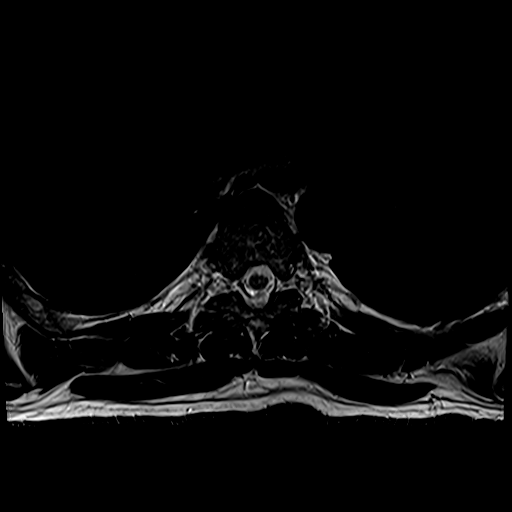

[18 of 48 positions shown; findings below may reference images not displayed]

FINDINGS: The visualized spinal cord is of normal caliber and signal intensity. No visible demyelinating plaque. Postcontrast images demonstrate no abnormal enhancement.
Osseous structures demonstrate no acute fracture or destructive lesion. No abnormal osseous edema. There is anterior wedging of L1 which may be congenital in nature or represent a remote inferior
endplate compression.
The alignment of the thoracic spine is normal on these supine, neutral images.
Disc desiccation, loss of disc space height and mild diffuse disc bulging at T12-L1. There is a posterior annular tear and shallow broad-based protrusion right foramen T12-L1.
No significant canal stenosis. There is mild right foraminal stenosis at T12-L1
Visualized soft tissues: normal.
IMPRESSION: 1.  No evidence of demyelinating plaque within the thoracic spinal cord. No abnormal cord enhancement.
2.  Mild degenerative disc disease at T12-L1 with mild right foraminal stenosis.
3.  Anterior wedging of L1 which may be congenital in nature or represent a remote inferior endplate compression fracture.
